# Patient Record
Sex: Female | Born: 1965 | Race: White | Hispanic: No | Smoking: Current every day smoker
Health system: Southern US, Community
[De-identification: ages and names within clinical notes are randomized; demographics above are authoritative.]

## PROBLEM LIST (undated history)

## (undated) DIAGNOSIS — R87629 Unspecified abnormal cytological findings in specimens from vagina: Secondary | ICD-10-CM

## (undated) DIAGNOSIS — R42 Dizziness and giddiness: Secondary | ICD-10-CM

## (undated) DIAGNOSIS — I10 Essential (primary) hypertension: Secondary | ICD-10-CM

## (undated) HISTORY — DX: Essential (primary) hypertension: I10

## (undated) HISTORY — PX: TUBAL LIGATION: SHX77

## (undated) HISTORY — PX: GANGLION CYST EXCISION: SHX1691

## (undated) HISTORY — PX: CARPAL TUNNEL RELEASE: SHX101

## (undated) HISTORY — DX: Unspecified abnormal cytological findings in specimens from vagina: R87.629

## (undated) HISTORY — DX: Dizziness and giddiness: R42

---

## 2013-08-16 ENCOUNTER — Emergency Department (HOSPITAL_COMMUNITY)
Admission: EM | Admit: 2013-08-16 | Discharge: 2013-08-16 | Disposition: A | Discharge status: Home / Self Care | Payer: Self-pay | Attending: Emergency Medicine | Admitting: Emergency Medicine

## 2013-08-16 ENCOUNTER — Encounter (HOSPITAL_COMMUNITY): Payer: Self-pay | Admitting: Emergency Medicine

## 2013-08-16 DIAGNOSIS — J020 Streptococcal pharyngitis: Secondary | ICD-10-CM | POA: Insufficient documentation

## 2013-08-16 DIAGNOSIS — F17200 Nicotine dependence, unspecified, uncomplicated: Secondary | ICD-10-CM | POA: Insufficient documentation

## 2013-08-16 LAB — RAPID STREP SCREEN (MED CTR MEBANE ONLY): Streptococcus, Group A Screen (Direct): POSITIVE — AB

## 2013-08-16 MED ORDER — HYDROCODONE-ACETAMINOPHEN 7.5-325 MG/15ML PO SOLN: 15.0000 mL | Freq: Four times a day (QID) | ORAL | Status: DC | PRN

## 2013-08-16 MED ORDER — PENICILLIN G BENZATHINE 1200000 UNIT/2ML IM SUSP
1.2000 10*6.[IU] | Freq: Once | INTRAMUSCULAR | Status: AC
  Administered 2013-08-16: 1.2 10*6.[IU] via INTRAMUSCULAR
  Filled 2013-08-16: qty 2

## 2013-08-16 NOTE — ED Notes (Signed)
Pt reporting sore throat began Thursday, also experiencing some pain in left ear.  Reports no relief from OTC allergy medications.

## 2013-08-16 NOTE — ED Provider Notes (Signed)
CSN: [id number]     Arrival date & time 08/16/13  1849 History   First MD Initiated Contact with Patient 08/16/13 1900     Chief Complaint  Patient presents with  . Sore Throat      HPI Pt was seen at 1910.  Per pt, c/o gradual onset and persistence of constant sore throat, runny/stuffy nose, sinus congestion, ears congestion, and cough for the past 5 days. Has been taking OTC Claritin without change in her symptoms. Denies fevers, no rash, no CP/SOB, no N/V/D, no abd pain.     History reviewed. No pertinent past medical history.  Past Surgical History  Procedure Laterality Date  . Tubal ligation      History  Substance Use Topics  . Smoking status: Current Every Day Smoker -- 0.50 packs/day  . Smokeless tobacco: Not on file  . Alcohol Use: No    Review of Systems ROS: Statement: All systems negative except as marked or noted in the HPI; Constitutional: Negative for fever and chills. ; ; Eyes: Negative for eye pain, redness and discharge. ; ; ENMT: Negative for ear pain, hoarseness, +rhinorrhea, ears congestion, nasal congestion, sinus pressure and sore throat. ; ; Cardiovascular: Negative for chest pain, palpitations, diaphoresis, dyspnea and peripheral edema. ; ; Respiratory: +cough. Negative for wheezing and stridor. ; ; Gastrointestinal: Negative for nausea, vomiting, diarrhea, abdominal pain, blood in stool, hematemesis, jaundice and rectal bleeding. . ; ; Genitourinary: Negative for dysuria, flank pain and hematuria. ; ; Musculoskeletal: Negative for back pain and neck pain. Negative for swelling and trauma.; ; Skin: Negative for pruritus, rash, abrasions, blisters, bruising and skin lesion.; ; Neuro: Negative for headache, lightheadedness and neck stiffness. Negative for weakness, altered level of consciousness , altered mental status, extremity weakness, paresthesias, involuntary movement, seizure and syncope.     Allergies  Review of patient's allergies indicates no known  allergies.  Home Medications   Prior to Admission medications   Not on File   BP 151/79  Pulse 77  Temp(Src) 98.2 F (36.8 C)  Resp 18  Ht 5\' 2"  (1.575 m)  Wt 190 lb (86.183 kg)  BMI 34.74 kg/m2  SpO2 95%  LMP 07/19/2013 Physical Exam 1915; Physical examination:  Nursing notes reviewed; Vital signs and O2 SAT reviewed;  Constitutional: Well developed, Well nourished, Well hydrated, In no acute distress; Head:  Normocephalic, atraumatic; Eyes: EOMI, PERRL, No scleral icterus; ENMT: TM's clear bilat. +edemetous nasal turbinates bilat with clear rhinorrhea. +post pharyngeal erythema. Mouth and pharynx without lesions. No tonsillar exudates. No intra-oral edema. No submandibular or sublingual edema. No hoarse voice, no drooling, no stridor. No pain with manipulation of larynx. No trismus. Mucous membranes moist; Neck: Supple, Full range of motion, No lymphadenopathy; Cardiovascular: Regular rate and rhythm, No murmur, rub, or gallop; Respiratory: Breath sounds clear & equal bilaterally, No rales, rhonchi, wheezes.  Speaking full sentences with ease, Normal respiratory effort/excursion; Chest: Nontender, Movement normal; Abdomen: Soft, Nontender, Nondistended, Normal bowel sounds; Genitourinary: No CVA tenderness; Extremities: Pulses normal, No tenderness, No edema, No calf edema or asymmetry.; Neuro: AA&Ox3, Major CN grossly intact.  Speech clear. No gross focal motor or sensory deficits in extremities. Climbs on and off stretcher easily by herself. Gait steady.; Skin: Color normal, Warm, Dry.   ED Course  Procedures     MDM  MDM Reviewed: previous chart, nursing note and vitals Interpretation: labs   Results for orders placed during the hospital encounter of 08/16/13  RAPID STREP SCREEN  Result Value Ref Range   Streptococcus, Group A Screen (Direct) POSITIVE (*) NEGATIVE    1940:  Will dose IM PCN while in the ED. Pt wants to go home now. Dx and testing d/w pt and family.   Questions answered.  Verb understanding, agreeable to d/c home with outpt f/u.     Laray AngerKathleen M McManus, DO 08/18/13 [id number]

## 2013-08-16 NOTE — Discharge Instructions (Signed)
°Emergency Department Resource Guide °1) Find a Doctor and Pay Out of Pocket °Although you won't have to find out who is covered by your insurance plan, it is a good idea to ask around and get recommendations. You will then need to call the office and see if the doctor you have chosen will accept you as a new patient and what types of options they offer for patients who are self-pay. Some doctors offer discounts or will set up payment plans for their patients who do not have insurance, but you will need to ask so you aren't surprised when you get to your appointment. ° °2) Contact Your Local Health Department °Not all health departments have doctors that can see patients for sick visits, but many do, so it is worth a call to see if yours does. If you don't know where your local health department is, you can check in your phone book. The CDC also has a tool to help you locate your state's health department, and many state websites also have listings of all of their local health departments. ° °3) Find a Walk-in Clinic °If your illness is not likely to be very severe or complicated, you may want to try a walk in clinic. These are popping up all over the country in pharmacies, drugstores, and shopping centers. They're usually staffed by nurse practitioners or physician assistants that have been trained to treat common illnesses and complaints. They're usually fairly quick and inexpensive. However, if you have serious medical issues or chronic medical problems, these are probably not your best option. ° °No Primary Care Doctor: °- Call [facility] at  832-8000 - they can help you locate a primary care doctor that  accepts your insurance, provides certain services, etc. °- Physician Referral Service- 1-800-533-3463 ° °Chronic Pain Problems: °Organization         Address  Phone   Notes  °[facility]  (336) [phone] Patients need to be referred by their primary care doctor.  ° °Medication  Assistance: °Organization         Address  Phone   Notes  °[place] [facility] [address] °[place], [place] [address] (336) 641-8030 --Must be a resident of [place] °-- Must have NO insurance coverage whatsoever (no Medicaid/ Medicare, etc.) °-- The pt. MUST have a primary care doctor that directs their care regularly and follows them in the community °  °[facility]  (866) 331-1348   °[employer]  (888) 892-1162   ° °Agencies that provide inexpensive medical care: °Organization         Address  Phone   Notes  °[facility]  (336) 832-8035   °[facility]    (336) 832-7272   °[facility] [address] °[place], [place] [address] (336) 832-4777   °[facility] [address], °[place] (336) 271-4999   °[facility]    (336) 373-0678   °[facility]    (336) 272-1050   °[facility] ° [address], [place] Phone:  (336) 832-4444, Fax:  (336) 832-4440 Hours of Operation:  9 am - 6 pm, M-F.  Also accepts Medicaid/Medicare and self-pay.  °[facility] ° [address], [place] Phone: (336) 832-3150, Fax: (336) [phone]. Hours of Operation:  8:30 am - 5:30 pm, M-F.  Also accepts Medicaid and self-pay.  °[facility] [place] [address], [place] Phone: (336) 878-6027   °[facility] [address], [place], [place] (336)723-1848, Ext. 123 Mondays & Thursdays: 7-9 AM.  First 15 patients are seen on a first come, first serve basis. °  ° °Medicaid-accepting [place] Providers: ° °Organization         Address  Phone   Notes  °[facility] [address], [place] (336) 641-2100 Also accepts self-pay patients.  °[facility] [address], [place] ° (336) 856-9996   °[facility] [address], [place]  (336) 288-8857   °[facility] [address] [place] [address], [place] (336) 299-7000   °Veita Bland [address], [place]  ° (336) 373-1557 Only accepts [place] Access Medicaid patients after they have their name applied to their card.  ° °Self-Pay (no insurance) in [place]: ° °Organization         Address  Phone   Notes  °Sickle Cell Patients, [facility] [address], [place] (336) 832-1970   °[facility] [address], [place] (336) 832-4400   °[facility] ° [address], [place] (336) 992-4800   °[facility]/Dr. Osei-Bonsu ° [address] [place] [address], [place] or [address], [place] (336) 841-8500 Phone number for both [place] and [place] locations is the same.  °[facility] [address], [place] (336) 299-0000   °[facility] [address] [place] [address], [place] or [address] (336) 852-7530 °(336) 878-2260   °[facility] [address], [place] (336) 350-1642, phone; (336) 294-5005, fax Sees patients 1st and 3rd Saturday of every month.  Must not qualify for public or private insurance (i.e. Medicaid, Medicare, [place] Health Choice, Veterans' Benefits) • Household income should be no more than 200% of the poverty level •The clinic cannot treat you if you are pregnant or think you are pregnant • Sexually transmitted diseases are not treated at the clinic.  ° ° °Dental Care: °Organization         Address  Phone  Notes  °[place] [facility] [address], [place] (336) 641-6152 Accepts children up to age 21 who are enrolled in Medicaid or [place] Health Choice; pregnant women with a Medicaid card; and children who have applied for Medicaid or [place] Health Choice, but were declined, whose parents can pay a reduced fee at time of service.  °[place]  [facility] [place]  [address], [place] (336) 641-7733 Accepts children up to age 21 who are enrolled in Medicaid or [place] Health Choice; pregnant women with a Medicaid card; and children who have applied for Medicaid or [place] Health Choice, but were declined, whose parents can pay a reduced fee at time of service.  °[facility] ° [address], [place] (336) 641-4533 Patients are seen by appointment only. Walk-ins are not accepted. [facility] will see patients 18 years of age and older. °Monday - Tuesday (8am-5pm) °Most Wednesdays (8:30-5pm) °$30 per visit, cash only  °[facility] ° [address], [place] (336) 641-4533 Patients are seen by appointment only. Walk-ins are not accepted. [facility] will see patients 18 years of age and older. °One   Wednesday Evening (Monthly: Volunteer Based).  $30 per visit, cash only  °[facility]  (919) 537-3737 for adults; Children under age 4, call Graduate Pediatric Dentistry at ([phone]) [phone]. Children aged 4-14, please call (919) [phone] to request a pediatric application. ° Dental services are provided in all areas of dental care including fillings, crowns and bridges, complete and partial dentures, implants, gum treatment, root canals, and extractions. Preventive care is also provided. Treatment is provided to both adults and children. °Patients are selected via a lottery and there is often a waiting list. °  °[facility] [address], °[place] ° (336) 763-8833 [email or web address] °  °[facility] [address], [place], [place] (336)723-1848, Ext. 123 Second and Fourth Thursday of each month, opens at 6:30 AM; Clinic ends at 9 AM.  Patients are seen on a first-come first-served basis, and a limited number are seen during each clinic.  ° °[facility] ° [address], [place], [place] (336) 723-7904    Eligibility Requirements °You must have lived in [place], [place], or [place] counties for at least the last three months. °  You cannot be eligible for state or federal sponsored healthcare insurance, including Veterans Administration, Medicaid, or Medicare. °  You generally cannot be eligible for healthcare insurance through your employer.  °  How to apply: °Eligibility screenings are held every Tuesday and Wednesday afternoon from 1:00 pm until 4:00 pm. You do not need an appointment for the interview!  °[facility] [address], [place], [place] 336-631-2330   °[facility]  336-342-8273   °[facility]  336-703-3100   °[facility]  336-570-6415   ° °Behavioral Health Resources in the Community: °Intensive Outpatient Programs °Organization         Address  Phone  Notes  °[facility] [address], [place], [place] 336-878-6098   °[facility] [address], [place], [place] 336-832-9800   °[facility] [address], [place], [place] ° 336-882-2125   °[facility] [address],  °[place], [place] 1-800-853-5163 or 336-641-4981   °Substance Abuse Resources °Organization         Address  Phone  Notes  °[facility]  336-882-2125   °[facility]  336-784-9470   °[facility]  336-285-9073   °[facility]  336-845-3988   °Residential & Outpatient Substance Abuse Program  1-800-659-3381   °Psychological Services °Organization         Address  Phone  Notes  °[facility]  336- 832-9600   °[facility]  336- 378-7881   °[facility] [address], [place] 1-800-853-5163 or 336-641-4981   ° °Mobile Crisis Teams °Organization         Address  Phone  Notes  °[facility], Mobile Crisis Care Unit  1-877-626-1772   °Assertive °[facility] ° [address]  [place], [place] 336-834-9664   °Sharon DeEsch [address] °[place] 336-554-5454   ° °Self-Help/Support Groups °Organization         Address  Phone             Notes  °[facility] of  - variety of support groups  336- 373-1402 Call for more information  °Narcotics Anonymous (NA), [facility] [address], °[place]  2 meetings at this location  ° °  Residential Treatment Programs Organization         Address  Phone  Notes  [facility] [address],    [place]  [phone]   [facility]  [address], [place] [phone], [place], [place] [phone]   [facility] [address], [place] [phone] Admissions: 8am-3pm M-F  [facility] [address]    [place], [place] [phone]   [facility] [address], [place], [place] [phone]   [facility] [address]  [place], [place] [phone]   [facility] Intensive Outpatient [address], [place], [place] [phone]   [facility] ([facility] [address]  [place], [place] [phone] or ([phone]   [facility] [address] [place], [place] [phone] Accepts Medicaid  [facility] [address]  [place] [phone] Substance Abuse/Addiction Treatment   [facility] Resources Organization         Address  Phone  Notes  [facility]  930-231-7115(888) 712-119-7891   Angie FavaJulie Brannon, PhD [address], [address] [place], [place]   [phone](336) [phone] or (503)[phone](336) [phone]   [facility]   [address] [place], [place] [phone](336) [phone]   [facility] [address], [place], [place] [phone](336) [phone] Insurance/Medicaid/sponsorship through [facility] [address]                                    [place], [place] [phone](336) [phone] Therapy/tele-psych/case    [facility] [address]   [place], [place] [phone](336) [phone]    Dr. Lolly MustacheArfeen  [phone](336) [phone]   [facility] [place] [place]  [facility] 1) [address], [place] 2) [address], [place] 3)  [address], [place] [phone](336) [phone](336) [phone]  [phone](336) [phone]   [facility] Child Abuse Hotline 725-069-9500(336) 930-837-8726 or 716-614-0325(336) (601) [phone] (After Hours)       Take over the counter tylenol and ibuprofen, as directed on packaging, as needed for discomfort. Do not take extra tylenol if you are taking the prescription pain medication because it already has tylenol in it. You received an injection of penicillin while you were in the Emergency Department today; this will fully treat your "strep throat."  Gargle with warm water several times per day to help with discomfort.  May also use over the counter sore throat pain medicines such as chloraseptic or sucrets, as directed on packaging, as needed for discomfort.  Call your regular medical doctor tomorrow to schedule a follow up appointment this week.  Return to the Emergency Department immediately if worsening.

## 2015-11-24 ENCOUNTER — Encounter (HOSPITAL_COMMUNITY): Payer: Self-pay

## 2015-11-24 ENCOUNTER — Emergency Department (HOSPITAL_COMMUNITY)
Admission: EM | Admit: 2015-11-24 | Discharge: 2015-11-24 | Disposition: A | Discharge status: Home / Self Care | Payer: 59 | Attending: Emergency Medicine | Admitting: Emergency Medicine

## 2015-11-24 ENCOUNTER — Emergency Department (HOSPITAL_COMMUNITY): Payer: 59

## 2015-11-24 DIAGNOSIS — F17200 Nicotine dependence, unspecified, uncomplicated: Secondary | ICD-10-CM | POA: Insufficient documentation

## 2015-11-24 DIAGNOSIS — R42 Dizziness and giddiness: Secondary | ICD-10-CM | POA: Insufficient documentation

## 2015-11-24 DIAGNOSIS — Z79899 Other long term (current) drug therapy: Secondary | ICD-10-CM | POA: Insufficient documentation

## 2015-11-24 LAB — URINE MICROSCOPIC-ADD ON

## 2015-11-24 LAB — PREGNANCY, URINE: PREG TEST UR: NEGATIVE

## 2015-11-24 LAB — CBC WITH DIFFERENTIAL/PLATELET
BASOS PCT: 1 %
Basophils Absolute: 0 10*3/uL (ref 0.0–0.1)
Eosinophils Absolute: 0.2 10*3/uL (ref 0.0–0.7)
Eosinophils Relative: 3 %
HEMATOCRIT: 42.4 % (ref 36.0–46.0)
HEMOGLOBIN: 14.9 g/dL (ref 12.0–15.0)
Lymphocytes Relative: 30 %
Lymphs Abs: 2.4 10*3/uL (ref 0.7–4.0)
MCH: 32.7 pg (ref 26.0–34.0)
MCHC: 35.1 g/dL (ref 30.0–36.0)
MCV: 93 fL (ref 78.0–100.0)
Monocytes Absolute: 0.7 10*3/uL (ref 0.1–1.0)
Monocytes Relative: 9 %
NEUTROS ABS: 4.5 10*3/uL (ref 1.7–7.7)
Neutrophils Relative %: 57 %
Platelets: 221 10*3/uL (ref 150–400)
RBC: 4.56 MIL/uL (ref 3.87–5.11)
RDW: 13.5 % (ref 11.5–15.5)
WBC: 7.8 10*3/uL (ref 4.0–10.5)

## 2015-11-24 LAB — URINALYSIS, ROUTINE W REFLEX MICROSCOPIC
Bilirubin Urine: NEGATIVE
Glucose, UA: NEGATIVE mg/dL
KETONES UR: NEGATIVE mg/dL
Nitrite: NEGATIVE
Protein, ur: NEGATIVE mg/dL
Specific Gravity, Urine: 1.02 (ref 1.005–1.030)
pH: 5.5 (ref 5.0–8.0)

## 2015-11-24 LAB — COMPREHENSIVE METABOLIC PANEL
ALT: 17 U/L (ref 14–54)
ANION GAP: 7 (ref 5–15)
AST: 18 U/L (ref 15–41)
Albumin: 3.9 g/dL (ref 3.5–5.0)
Alkaline Phosphatase: 76 U/L (ref 38–126)
BUN: 15 mg/dL (ref 6–20)
CO2: 26 mmol/L (ref 22–32)
Calcium: 9 mg/dL (ref 8.9–10.3)
Chloride: 102 mmol/L (ref 101–111)
Creatinine, Ser: 0.72 mg/dL (ref 0.44–1.00)
GFR calc non Af Amer: >60 mL/min (ref >60)
Glucose, Bld: 106 mg/dL — ABNORMAL HIGH (ref 65–99)
Potassium: 4 mmol/L (ref 3.5–5.1)
SODIUM: 135 mmol/L (ref 135–145)
Total Bilirubin: 0.6 mg/dL (ref 0.3–1.2)
Total Protein: 7.9 g/dL (ref 6.5–8.1)

## 2015-11-24 LAB — POC URINE PREG, ED: Preg Test, Ur: NEGATIVE

## 2015-11-24 MED ORDER — MECLIZINE HCL 12.5 MG PO TABS
25.0000 mg | ORAL_TABLET | Freq: Once | ORAL | Status: AC
  Administered 2015-11-24: 25 mg via ORAL
  Filled 2015-11-24: qty 2

## 2015-11-24 MED ORDER — MECLIZINE HCL 25 MG PO TABS: 25.0000 mg | ORAL_TABLET | Freq: Three times a day (TID) | ORAL | 0 refills | Status: AC | PRN

## 2015-11-24 MED ORDER — SODIUM CHLORIDE 0.9 % IV BOLUS (SEPSIS)
1000.0000 mL | Freq: Once | INTRAVENOUS | Status: AC
  Administered 2015-11-24: 1000 mL via INTRAVENOUS

## 2015-11-24 NOTE — ED Triage Notes (Signed)
Pt reports dizziness and feeling off balance for 6 days. Denies CP or SOB. Reports ears being stopped up and nasal congestion in which she is taking sudafed . No nausea

## 2015-11-24 NOTE — ED Provider Notes (Signed)
[facility] DEPT Provider Note   CSN: [id number] Arrival date & time: 11/24/15  0716     History   Chief Complaint Chief Complaint  Patient presents with  . Dizziness    HPI Robin Frost is a 50 y.o. female.  Patient complains of dizziness. Patient states room spinning when she gets up and walks around   The history is provided by the patient.  Dizziness  Quality:  Head spinning Severity:  Mild Onset quality:  Sudden Timing:  Constant Progression:  Unchanged Chronicity:  New Context: bending over   Relieved by:  Nothing Worsened by:  Turning head and standing up Associated symptoms: no blood in stool, no chest pain, no diarrhea and no headaches     History reviewed. No pertinent past medical history.  There are no active problems to display for this patient.   Past Surgical History:  Procedure Laterality Date  . TUBAL LIGATION      OB History    No data available       Home Medications    Prior to Admission medications   Medication Sig Start Date End Date Taking? Authorizing Provider  pseudoephedrine (SUDAFED) 60 MG tablet Take 60 mg by mouth every 4 (four) hours as needed for congestion.   Yes Historical Provider, MD  meclizine (ANTIVERT) 25 MG tablet Take 1 tablet (25 mg total) by mouth 3 (three) times daily as needed for dizziness. 11/24/15   Bethann Berkshire, MD    Family History No family history on file.  Social History Social History  Substance Use Topics  . Smoking status: Current Every Day Smoker    Packs/day: 0.50    Years: 5.00  . Smokeless tobacco: Never Used  . Alcohol use No     Allergies   Review of patient's allergies indicates no known allergies.   Review of Systems Review of Systems  Constitutional: Negative for appetite change and fatigue.  HENT: Negative for congestion, ear discharge and sinus pressure.   Eyes: Negative for discharge.  Respiratory: Negative for cough.   Cardiovascular: Negative for chest pain.    Gastrointestinal: Negative for abdominal pain, blood in stool and diarrhea.  Genitourinary: Negative for frequency and hematuria.  Musculoskeletal: Negative for back pain.  Skin: Negative for rash.  Neurological: Positive for dizziness. Negative for seizures and headaches.  Psychiatric/Behavioral: Negative for hallucinations.     Physical Exam Updated Vital Signs BP 141/82 (BP Location: Left Arm)   Pulse 70   Temp 97.5 F (36.4 C) (Oral)   Resp 18   Ht 5\' 2"  (1.575 m)   Wt 195 lb (88.5 kg)   LMP 10/23/2015   SpO2 100%   BMI 35.67 kg/m   Physical Exam  Constitutional: She is oriented to person, place, and time. She appears well-developed.  HENT:  Head: Normocephalic.  Eyes: Conjunctivae and EOM are normal. No scleral icterus.  Neck: Neck supple. No thyromegaly present.  Cardiovascular: Normal rate and regular rhythm.  Exam reveals no gallop and no friction rub.   No murmur heard. Pulmonary/Chest: No stridor. She has no wheezes. She has no rales. She exhibits no tenderness.  Abdominal: She exhibits no distension. There is no tenderness. There is no rebound.  Musculoskeletal: Normal range of motion. She exhibits no edema.  Lymphadenopathy:    She has no cervical adenopathy.  Neurological: She is oriented to person, place, and time. She exhibits normal muscle tone. Coordination normal.  Skin: No rash noted. No erythema.  Psychiatric: She has a normal  mood and affect. Her behavior is normal.     ED Treatments / Results  Labs (all labs ordered are listed, but only abnormal results are displayed) Labs Reviewed  COMPREHENSIVE METABOLIC PANEL - Abnormal; Notable for the following:       Result Value   Glucose, Bld 106 (*)    All other components within normal limits  URINALYSIS, ROUTINE W REFLEX MICROSCOPIC (NOT AT [facility]) - Abnormal; Notable for the following:    APPearance HAZY (*)    Hgb urine dipstick LARGE (*)    Leukocytes, UA TRACE (*)    All other components  within normal limits  URINE MICROSCOPIC-ADD ON - Abnormal; Notable for the following:    Squamous Epithelial / LPF 0-5 (*)    Bacteria, UA FEW (*)    All other components within normal limits  CBC WITH DIFFERENTIAL/PLATELET  PREGNANCY, URINE  POC URINE PREG, ED    EKG  EKG Interpretation None       Radiology Ct Head Wo Contrast  Result Date: 11/24/2015 CLINICAL DATA:  Six day history of dizziness EXAM: CT HEAD WITHOUT CONTRAST TECHNIQUE: Contiguous axial images were obtained from the base of the skull through the vertex without intravenous contrast. COMPARISON:  None. FINDINGS: Brain: The ventricles are normal in size and configuration. There is mild frontal lobe atrophy for age bilaterally. There is no intracranial mass, hemorrhage, extra-axial fluid collection, or midline shift. Gray-white compartments are normal. No acute infarct evident. Vascular: No hyperdense vessel. No evident arterial vascular calcification. Skull: Bony calvarium appears intact. Sinuses/Orbits: There is mild mucosal thickening in several ethmoid air cells bilaterally. Visualized paranasal sinuses elsewhere clear. There is leftward deviation of the nasal septum. No intraorbital lesions are evident on either side. Other: Mastoid air cells are hypoplastic on the right. Mastoid air cells which are aerated are clear. IMPRESSION: Mild frontal lobe atrophy. Ventricles normal in size and configuration. No intracranial mass, hemorrhage, or extra-axial fluid. Gray-white compartments appear normal. Mild ethmoid sinus disease bilaterally. Electronically Signed   By: Bretta BangWilliam  Woodruff III M.D.   On: 11/24/2015 08:32    Procedures Procedures (including critical care time)  Medications Ordered in ED Medications  meclizine (ANTIVERT) tablet 25 mg (not administered)  sodium chloride 0.9 % bolus 1,000 mL (0 mLs Intravenous Stopped 11/24/15 0945)     Initial Impression / Assessment and Plan / ED Course  I have reviewed the  triage vital signs and the nursing notes.  Pertinent labs & imaging results that were available during my care of the patient were reviewed by me and considered in my medical decision making (see chart for details).  Clinical Course    Labs and CT scan unremarkable. Suspect vertigo. Patient will be treated with Antivert will follow-up with PCP  Final Clinical Impressions(s) / ED Diagnoses   Final diagnoses:  Dizziness  Vertigo    New Prescriptions New Prescriptions   MECLIZINE (ANTIVERT) 25 MG TABLET    Take 1 tablet (25 mg total) by mouth 3 (three) times daily as needed for dizziness.     Bethann BerkshireJoseph Jearldine Cassady, MD 11/24/15 1053

## 2015-11-24 NOTE — Discharge Instructions (Signed)
Follow-up this week with your doctor for recheck

## 2015-11-27 ENCOUNTER — Other Ambulatory Visit (HOSPITAL_COMMUNITY): Payer: Self-pay | Admitting: Internal Medicine

## 2015-11-27 DIAGNOSIS — Z1231 Encounter for screening mammogram for malignant neoplasm of breast: Secondary | ICD-10-CM

## 2015-11-27 DIAGNOSIS — N390 Urinary tract infection, site not specified: Secondary | ICD-10-CM | POA: Diagnosis not present

## 2015-11-27 DIAGNOSIS — R42 Dizziness and giddiness: Secondary | ICD-10-CM | POA: Diagnosis not present

## 2015-11-27 DIAGNOSIS — Z0000 Encounter for general adult medical examination without abnormal findings: Secondary | ICD-10-CM | POA: Diagnosis not present

## 2015-12-05 ENCOUNTER — Ambulatory Visit (INDEPENDENT_AMBULATORY_CARE_PROVIDER_SITE_OTHER): Payer: 59 | Admitting: Obstetrics and Gynecology

## 2015-12-05 ENCOUNTER — Encounter: Payer: Self-pay | Admitting: Obstetrics and Gynecology

## 2015-12-05 ENCOUNTER — Other Ambulatory Visit (HOSPITAL_COMMUNITY)
Admission: RE | Admit: 2015-12-05 | Discharge: 2015-12-05 | Disposition: A | Discharge status: Home / Self Care | Payer: 59 | Source: Ambulatory Visit | Attending: Obstetrics and Gynecology | Admitting: Obstetrics and Gynecology

## 2015-12-05 VITALS — BP 160/82 | HR 66 | Ht 62.0 in | Wt 215.2 lb

## 2015-12-05 DIAGNOSIS — Z01411 Encounter for gynecological examination (general) (routine) with abnormal findings: Secondary | ICD-10-CM | POA: Diagnosis not present

## 2015-12-05 DIAGNOSIS — Z3202 Encounter for pregnancy test, result negative: Secondary | ICD-10-CM

## 2015-12-05 DIAGNOSIS — Z1151 Encounter for screening for human papillomavirus (HPV): Secondary | ICD-10-CM | POA: Insufficient documentation

## 2015-12-05 DIAGNOSIS — Z01419 Encounter for gynecological examination (general) (routine) without abnormal findings: Secondary | ICD-10-CM

## 2015-12-05 DIAGNOSIS — N912 Amenorrhea, unspecified: Secondary | ICD-10-CM

## 2015-12-05 DIAGNOSIS — N870 Mild cervical dysplasia: Secondary | ICD-10-CM | POA: Diagnosis not present

## 2015-12-05 LAB — POCT URINE PREGNANCY: Preg Test, Ur: NEGATIVE

## 2015-12-05 NOTE — Progress Notes (Signed)
Patient ID: Robin Frost, female   DOB: 01-14-1966, 50 y.o.   MRN: [id number]  Assessment:  1. Annual Gyn Exam 2. Lengthy discussion of healthy eating habits, lifestyle changes recommended.   3. Encouraged adequate water intake throughout the day to prevent overeating.    Plan:  1. pap smear done, next pap due in 3 years  2. return annually or prn 3    Annual mammogram and regular self exams advised  Subjective:   Chief Complaint  Patient presents with  . Gynecologic Exam     Robin Frost is a 50 y.o. female No obstetric history on file. who presents for annual exam. Patient's last menstrual period was 10/23/2015. The patient has no complaints today. She notes her period is late this month. Urine preg negative today.  She denies any abdominal pain or discomfort. She reports her periods typically have 3 days of heavy bleeding followed by a few days of light bleeding. She does note some recent weight gain, but attributes this to her diet.   PCP- Dr. Selena Batten   The following portions of the patient's history were reviewed and updated as appropriate: allergies, current medications, past family history, past medical history, past social history, past surgical history and problem list. History reviewed. No pertinent past medical history.  Past Surgical History:  Procedure Laterality Date  . CARPAL TUNNEL RELEASE     left wrist  . GANGLION CYST EXCISION     right foot  . TUBAL LIGATION       Current Outpatient Prescriptions:  .  meclizine (ANTIVERT) 25 MG tablet, Take 1 tablet (25 mg total) by mouth 3 (three) times daily as needed for dizziness. (Patient not taking: Reported on 12/05/2015), Disp: 30 tablet, Rfl: 0 .  pseudoephedrine (SUDAFED) 60 MG tablet, Take 60 mg by mouth every 4 (four) hours as needed for congestion., Disp: , Rfl:   Review of Systems Constitutional: negative Gastrointestinal: negative Genitourinary: negative   Objective:  BP (!) 160/82   Pulse 66   Ht 5'  2" (1.575 m)   Wt 215 lb 3.2 oz (97.6 kg)   LMP 10/23/2015   BMI 39.36 kg/m    BMI: Body mass index is 39.36 kg/m.  General Appearance: Alert, appropriate appearance for age. No acute distress HEENT: Grossly normal Neck / Thyroid:  Cardiovascular: RRR; normal S1, S2, no murmur Lungs: CTA bilaterally Back: No CVAT Breast Exam: No masses or nodes.No dimpling, nipple retraction or discharge. Gastrointestinal: Soft, non-tender, no masses or organomegaly Pelvic Exam:  External genitalia: normal general appearance Vaginal: normal mucosa without prolapse or lesions Cervix: normal appearance Adnexa: normal bimanual exam Uterus: normal single, nontender, 8w size, anterior  Rectovaginal: normal rectal, no masses Lymphatic Exam: Non-palpable nodes in neck, clavicular, axillary, or inguinal regions  Skin: no rash or abnormalities Neurologic: Normal gait and speech, no tremor  Psychiatric: Alert and oriented, appropriate affect.  Urinalysis:Not done  Guaiac negative   Christin Bach. MD Pgr [phone] 8:53 AM  The provider spent over 25 minutes with the visit, including pre visit review, documentation, with >than 50% spent in counseling and coordination of care.   By signing my name below, I, Doreatha Martin, attest that this documentation has been prepared under the direction and in the presence of Tilda Burrow, MD. Electronically Signed: Doreatha Martin, ED Scribe. 12/05/15. 8:53 AM.  I personally performed the services described in this documentation, which was SCRIBED in my presence. The recorded information has been reviewed and considered accurate. It has  been edited as necessary during review. Tilda BurrowFERGUSON,Vi Biddinger V, MD

## 2015-12-07 LAB — CYTOLOGY - PAP: HPV: DETECTED — AB

## 2015-12-10 ENCOUNTER — Telehealth: Payer: Self-pay | Admitting: *Deleted

## 2015-12-10 ENCOUNTER — Ambulatory Visit (HOSPITAL_COMMUNITY)
Admission: RE | Admit: 2015-12-10 | Discharge: 2015-12-10 | Disposition: A | Discharge status: Home / Self Care | Payer: 59 | Source: Ambulatory Visit | Attending: Internal Medicine | Admitting: Internal Medicine

## 2015-12-10 ENCOUNTER — Encounter (HOSPITAL_COMMUNITY): Payer: Self-pay | Admitting: Radiology

## 2015-12-10 DIAGNOSIS — Z1231 Encounter for screening mammogram for malignant neoplasm of breast: Secondary | ICD-10-CM

## 2015-12-12 ENCOUNTER — Other Ambulatory Visit: Payer: Self-pay | Admitting: Internal Medicine

## 2015-12-12 DIAGNOSIS — R928 Other abnormal and inconclusive findings on diagnostic imaging of breast: Secondary | ICD-10-CM

## 2015-12-19 ENCOUNTER — Encounter: Payer: Self-pay | Admitting: Neurology

## 2015-12-19 ENCOUNTER — Ambulatory Visit (INDEPENDENT_AMBULATORY_CARE_PROVIDER_SITE_OTHER): Payer: 59 | Admitting: Neurology

## 2015-12-19 DIAGNOSIS — H8110 Benign paroxysmal vertigo, unspecified ear: Secondary | ICD-10-CM

## 2015-12-19 NOTE — Progress Notes (Signed)
PATIENT: Robin Frost DOB: Sep 18, 1965  Chief Complaint  Patient presents with  . Dizziness    States she had a one week period of dizziness that worsened with quick turns of her head to the left.  She went to the ED and was given meclizine. States these symptoms have now resolved and she has not had any further problems.  She occasionally wakes up with mild neck pain but feels it is related to her sleeping positions.  Marland Kitchen. PCP    Pearson GrippeJames Kim, MD     HISTORICAL  Robin Frost is a 50 years old right-handed female, seen in refer by her primary care doctor Pearson GrippeJames Kim, for evaluation of vertigo, initial evaluation was on December 19 2015.  She was previously healthy, works as a Arboriculturistcustodian for [employer], on November 18 2015, she woke up in the morning noticed sudden onset vertigo after she set up from the bed, room was spinning counterclockwise, mild unsteady gait, nauseous, worsening with sudden body movement. Her symptoms was intermittent throughout the week, by November 24 2015, she had severe vertigo, could not go to her work, went to the emergency room,  I personally reviewed CT head without contrast there was no significant abnormality. I reviewed her laboratory evaluation October 2017, normal CMP, CBC with hemoglobin of 14 point 9,   REVIEW OF SYSTEMS: Full 14 system review of systems performed and notable only for cough,  ALLERGIES: No Known Allergies  HOME MEDICATIONS: Current Outpatient Prescriptions  Medication Sig Dispense Refill  . meclizine (ANTIVERT) 25 MG tablet Take 1 tablet (25 mg total) by mouth 3 (three) times daily as needed for dizziness. 30 tablet 0  . pseudoephedrine (SUDAFED) 60 MG tablet Take 60 mg by mouth every 4 (four) hours as needed for congestion.     No current facility-administered medications for this visit.     PAST MEDICAL HISTORY: Past Medical History:  Diagnosis Date  . Vertigo     PAST SURGICAL HISTORY: Past Surgical History:    Procedure Laterality Date  . CARPAL TUNNEL RELEASE     left wrist  . GANGLION CYST EXCISION     right foot  . TUBAL LIGATION      FAMILY HISTORY: Family History  Problem Relation Age of Onset  . Diabetes Mother   . Hypertension Mother   . Diabetes Father   . Hypertension Father   . Diabetes Sister   . Hypertension Sister   . Thyroid disease Brother     SOCIAL HISTORY:  Social History   Social History  . Marital status: Widowed    Spouse name: N/A  . Number of children: 2  . Years of education: HS   Occupational History  . Housekeeping    Social History Main Topics  . Smoking status: Current Every Day Smoker    Packs/day: 0.50    Years: 5.00  . Smokeless tobacco: Never Used  . Alcohol use No  . Drug use: No  . Sexual activity: Yes    Partners: Male    Birth control/ protection: Surgical   Other Topics Concern  . Not on file   Social History Narrative   Lives at home with sister, bother and their families.   Right-handed.   1-2 cups caffeine daily.     PHYSICAL EXAM   Vitals:   12/19/15 1015  BP: 126/78  Pulse: 66  Weight: 213 lb 8 oz (96.8 kg)  Height: 5\' 2"  (1.575 m)    Not  recorded      Body mass index is 39.05 kg/m.  PHYSICAL EXAMNIATION:  Gen: NAD, conversant, well nourised, obese, well groomed                     Cardiovascular: Regular rate rhythm, no peripheral edema, warm, nontender. Eyes: Conjunctivae clear without exudates or hemorrhage Neck: Supple, no carotid bruits. Pulmonary: Clear to auscultation bilaterally   NEUROLOGICAL EXAM:  MENTAL STATUS: Speech:    Speech is normal; fluent and spontaneous with normal comprehension.  Cognition:     Orientation to time, place and person     Normal recent and remote memory     Normal Attention span and concentration     Normal Language, naming, repeating,spontaneous speech     Fund of knowledge   CRANIAL NERVES: CN II: Visual fields are full to confrontation. Fundoscopic  exam is normal with sharp discs and no vascular changes. Pupils are round equal and briskly reactive to light. CN III, IV, VI: extraocular movement are normal. No ptosis. CN V: Facial sensation is intact to pinprick in all 3 divisions bilaterally. Corneal responses are intact.  CN VII: Face is symmetric with normal eye closure and smile. CN VIII: Hearing is normal to rubbing fingers CN IX, X: Palate elevates symmetrically. Phonation is normal. CN XI: Head turning and shoulder shrug are intact CN XII: Tongue is midline with normal movements and no atrophy.  MOTOR: There is no pronator drift of out-stretched arms. Muscle bulk and tone are normal. Muscle strength is normal.  REFLEXES: Reflexes are 2+ and symmetric at the biceps, triceps, knees, and ankles. Plantar responses are flexor.  SENSORY: Intact to light touch, pinprick, positional sensation and vibratory sensation are intact in fingers and toes.  COORDINATION: Rapid alternating movements and fine finger movements are intact. There is no dysmetria on finger-to-nose and heel-knee-shin.    GAIT/STANCE: Posture is normal. Gait is steady with normal steps, base, arm swing, and turning. Heel and toe walking are normal. Tandem gait is normal.  Romberg is absent. Apley's maneuver: Right ear dependent positioning induced no dizziness, no vertigo   DIAGNOSTIC DATA (LABS, IMAGING, TESTING) - I reviewed patient records, labs, notes, testing and imaging myself where available.   ASSESSMENT AND PLAN  Robin Frost is a 50 y.o. female   Benign positional vertigo Much improved,   keep well hydration,   I have printed out educational information about vertigo, also taught her repositioning maneuver in case it recurrent again  Levert FeinsteinYijun Aveya Beal, M.D. Ph.D.  [facility] [address], [address] [place], [place] [address] Ph: [phone] Fax: [phone]  ZH:YQMVHCC:James Selena BattenKim, MD

## 2015-12-19 NOTE — Patient Instructions (Signed)
Benign Positional Vertigo Vertigo is the feeling that you or your surroundings are moving when they are not. Benign positional vertigo is the most common form of vertigo. The cause of this condition is not serious (is benign). This condition is triggered by certain movements and positions (is positional). This condition can be dangerous if it occurs while you are doing something that could endanger you or others, such as driving.  CAUSES In many cases, the cause of this condition is not known. It may be caused by a disturbance in an area of the inner ear that helps your brain to sense movement and balance. This disturbance can be caused by a viral infection (labyrinthitis), head injury, or repetitive motion. RISK FACTORS This condition is more likely to develop in:  Women.  People who are 50 years of age or older. SYMPTOMS Symptoms of this condition usually happen when you move your head or your eyes in different directions. Symptoms may start suddenly, and they usually last for less than a minute. Symptoms may include:  Loss of balance and falling.  Feeling like you are spinning or moving.  Feeling like your surroundings are spinning or moving.  Nausea and vomiting.  Blurred vision.  Dizziness.  Involuntary eye movement (nystagmus). Symptoms can be mild and cause only slight annoyance, or they can be severe and interfere with daily life. Episodes of benign positional vertigo may return (recur) over time, and they may be triggered by certain movements. Symptoms may improve over time. DIAGNOSIS This condition is usually diagnosed by medical history and a physical exam of the head, neck, and ears. You may be referred to a health care provider who specializes in ear, nose, and throat (ENT) problems (otolaryngologist) or a provider who specializes in disorders of the nervous system (neurologist). You may have additional testing, including:  MRI.  A CT scan.  Eye movement tests. Your  health care provider may ask you to change positions quickly while he or she watches you for symptoms of benign positional vertigo, such as nystagmus. Eye movement may be tested with an electronystagmogram (ENG), caloric stimulation, the Dix-Hallpike test, or the roll test.  An electroencephalogram (EEG). This records electrical activity in your brain.  Hearing tests. TREATMENT Usually, your health care provider will treat this by moving your head in specific positions to adjust your inner ear back to normal. Surgery may be needed in severe cases, but this is rare. In some cases, benign positional vertigo may resolve on its own in 2-4 weeks. HOME CARE INSTRUCTIONS Safety  Move slowly.Avoid sudden body or head movements.  Avoid driving.  Avoid operating heavy machinery.  Avoid doing any tasks that would be dangerous to you or others if a vertigo episode would occur.  If you have trouble walking or keeping your balance, try using a cane for stability. If you feel dizzy or unstable, sit down right away.  Return to your normal activities as told by your health care provider. Ask your health care provider what activities are safe for you. General Instructions  Take over-the-counter and prescription medicines only as told by your health care provider.  Avoid certain positions or movements as told by your health care provider.  Drink enough fluid to keep your urine clear or pale yellow.  Keep all follow-up visits as told by your health care provider. This is important. SEEK MEDICAL CARE IF:  You have a fever.  Your condition gets worse or you develop new symptoms.  Your family or friends   notice any behavioral changes.  Your nausea or vomiting gets worse.  You have numbness or a "pins and needles" sensation. SEEK IMMEDIATE MEDICAL CARE IF:  You have difficulty speaking or moving.  You are always dizzy.  You faint.  You develop severe headaches.  You have weakness in your  legs or arms.  You have changes in your hearing or vision.  You develop a stiff neck.  You develop sensitivity to light.   This information is not intended to replace advice given to you by your health care provider. Make sure you discuss any questions you have with your health care provider.   Document Released: 11/11/2005 Document Revised: 10/25/2014 Document Reviewed: 05/29/2014 Elsevier Interactive Patient Education 2016 [employer]  

## 2015-12-21 DIAGNOSIS — Z720 Tobacco use: Secondary | ICD-10-CM | POA: Diagnosis not present

## 2015-12-21 DIAGNOSIS — R05 Cough: Secondary | ICD-10-CM | POA: Diagnosis not present

## 2015-12-21 DIAGNOSIS — R42 Dizziness and giddiness: Secondary | ICD-10-CM | POA: Diagnosis not present

## 2015-12-25 ENCOUNTER — Ambulatory Visit
Admission: RE | Admit: 2015-12-25 | Discharge: 2015-12-25 | Disposition: A | Discharge status: Home / Self Care | Payer: 59 | Source: Ambulatory Visit | Attending: Internal Medicine | Admitting: Internal Medicine

## 2015-12-25 DIAGNOSIS — N6323 Unspecified lump in the left breast, lower outer quadrant: Secondary | ICD-10-CM | POA: Diagnosis not present

## 2015-12-25 DIAGNOSIS — R928 Other abnormal and inconclusive findings on diagnostic imaging of breast: Secondary | ICD-10-CM

## 2015-12-31 ENCOUNTER — Encounter (HOSPITAL_COMMUNITY): Payer: Self-pay | Admitting: Emergency Medicine

## 2015-12-31 ENCOUNTER — Emergency Department (HOSPITAL_COMMUNITY)
Admission: EM | Admit: 2015-12-31 | Discharge: 2015-12-31 | Disposition: A | Discharge status: Home / Self Care | Payer: 59 | Attending: Emergency Medicine | Admitting: Emergency Medicine

## 2015-12-31 DIAGNOSIS — F17200 Nicotine dependence, unspecified, uncomplicated: Secondary | ICD-10-CM | POA: Insufficient documentation

## 2015-12-31 DIAGNOSIS — Y999 Unspecified external cause status: Secondary | ICD-10-CM | POA: Diagnosis not present

## 2015-12-31 DIAGNOSIS — S39012A Strain of muscle, fascia and tendon of lower back, initial encounter: Secondary | ICD-10-CM | POA: Diagnosis not present

## 2015-12-31 DIAGNOSIS — Y929 Unspecified place or not applicable: Secondary | ICD-10-CM | POA: Insufficient documentation

## 2015-12-31 DIAGNOSIS — J209 Acute bronchitis, unspecified: Secondary | ICD-10-CM | POA: Diagnosis not present

## 2015-12-31 DIAGNOSIS — Y939 Activity, unspecified: Secondary | ICD-10-CM | POA: Diagnosis not present

## 2015-12-31 DIAGNOSIS — S3992XA Unspecified injury of lower back, initial encounter: Secondary | ICD-10-CM | POA: Diagnosis present

## 2015-12-31 DIAGNOSIS — X58XXXA Exposure to other specified factors, initial encounter: Secondary | ICD-10-CM | POA: Insufficient documentation

## 2015-12-31 DIAGNOSIS — T148XXA Other injury of unspecified body region, initial encounter: Secondary | ICD-10-CM

## 2015-12-31 MED ORDER — NAPROXEN 500 MG PO TABS: 500.0000 mg | ORAL_TABLET | Freq: Two times a day (BID) | ORAL | 0 refills | Status: DC

## 2015-12-31 MED ORDER — BENZONATATE 100 MG PO CAPS
200.0000 mg | ORAL_CAPSULE | Freq: Once | ORAL | Status: AC
  Administered 2015-12-31: 200 mg via ORAL
  Filled 2015-12-31: qty 2

## 2015-12-31 MED ORDER — BENZONATATE 100 MG PO CAPS: 200.0000 mg | ORAL_CAPSULE | Freq: Three times a day (TID) | ORAL | 0 refills | Status: DC | PRN

## 2015-12-31 MED ORDER — ALBUTEROL SULFATE HFA 108 (90 BASE) MCG/ACT IN AERS
2.0000 | INHALATION_SPRAY | Freq: Once | RESPIRATORY_TRACT | Status: AC
  Administered 2015-12-31: 2 via RESPIRATORY_TRACT
  Filled 2015-12-31: qty 6.7

## 2015-12-31 MED ORDER — NAPROXEN 250 MG PO TABS
500.0000 mg | ORAL_TABLET | Freq: Once | ORAL | Status: AC
  Administered 2015-12-31: 500 mg via ORAL
  Filled 2015-12-31: qty 2

## 2015-12-31 NOTE — ED Triage Notes (Signed)
Pt reports pain in her R buttock that started this am. Pt also has cough that started 2 weeks ago. Dry hacking cough.

## 2015-12-31 NOTE — Discharge Instructions (Signed)
Take 2 puffs of your inhaler every 4 hours if you are coughing or wheezing.  Apply heat to your lower back and buttock area 2-3 times daily for 20 minutes.  Get rechecked if your symptoms are not improving over the next week or for any worsened symptoms.

## 2016-01-01 NOTE — ED Provider Notes (Signed)
[facility] DEPT Provider Note   CSN: [id number] Arrival date & time: 12/31/15  1956     History   Chief Complaint Chief Complaint  Patient presents with  . Back Pain    HPI Robin Frost is a 50 y.o. female presenting with 2 complaints, the most pressing being right lower back and buttock pain which is worsened with movement and certain positions.  She denies a history of chronic back pain.    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  She denies radiation of pain into either lower extremity.  There has been no weakness or numbness in the lower extremities and no urinary or bowel retention or incontinence. She denies dysuria. Patient does not have a history of cancer or IVDU.  She has used rest without relief. . Additionally describes a dry cough x 2 weeks.  Denies chest pain, sob, fevers or wheezing.  She is a smoker. The history is provided by the patient.    Past Medical History:  Diagnosis Date  . Vertigo     Patient Active Problem List   Diagnosis Date Noted  . Benign positional vertigo 12/19/2015    Past Surgical History:  Procedure Laterality Date  . CARPAL TUNNEL RELEASE     left wrist  . GANGLION CYST EXCISION     right foot  . TUBAL LIGATION      OB History    Gravida Para Term Preterm AB Living   2 2 2          SAB TAB Ectopic Multiple Live Births                   Home Medications    Prior to Admission medications   Medication Sig Start Date End Date Taking? Authorizing Provider  benzonatate (TESSALON) 100 MG capsule Take 2 capsules (200 mg total) by mouth 3 (three) times daily as needed. 12/31/15   Burgess AmorJulie Nayelie Gionfriddo, PA-C  meclizine (ANTIVERT) 25 MG tablet Take 1 tablet (25 mg total) by mouth 3 (three) times daily as needed for dizziness. 11/24/15   Bethann BerkshireJoseph Zammit, MD  naproxen (NAPROSYN) 500 MG tablet Take 1 tablet (500 mg total) by mouth 2 (two) times  daily. 12/31/15   Burgess AmorJulie Telvin Reinders, PA-C  pseudoephedrine (SUDAFED) 60 MG tablet Take 60 mg by mouth every 4 (four) hours as needed for congestion.    Historical Provider, MD    Family History Family History  Problem Relation Age of Onset  . Diabetes Mother   . Hypertension Mother   . Diabetes Father   . Hypertension Father   . Diabetes Sister   . Hypertension Sister   . Thyroid disease Brother     Social History Social History  Substance Use Topics  . Smoking status: Current Every Day Smoker    Packs/day: 0.50    Years: 5.00  . Smokeless tobacco: Never Used  . Alcohol use No     Allergies   Patient has no known allergies.   Review of Systems Review of Systems  Constitutional: Negative for fever.  Respiratory: Positive for cough. Negative for shortness of breath.   Cardiovascular: Negative for chest pain and leg swelling.  Gastrointestinal: Negative for abdominal distention, abdominal pain and constipation.  Genitourinary: Negative for difficulty urinating, dysuria, flank pain, frequency and urgency.  Musculoskeletal: Positive for back pain. Negative for gait problem and joint swelling.  Skin: Negative for rash.  Neurological: Negative for weakness and numbness.     Physical Exam Updated Vital Signs BP 130/89   Pulse 86   Resp 16   Ht 5\' 2"  (1.575 m)   Wt 96.6 kg   LMP 12/19/2015   SpO2 100%   BMI 38.96 kg/m   Physical Exam  Constitutional: She appears well-developed and well-nourished.  HENT:  Head: Normocephalic.  Eyes: Conjunctivae are normal.  Neck: Normal range of motion. Neck supple.  Cardiovascular: Normal rate and intact distal pulses.   Pedal pulses normal.  Pulmonary/Chest: Effort normal and breath sounds normal. No respiratory distress. She has no wheezes.  Abdominal: Soft. Bowel sounds are normal. She exhibits no distension and no mass.  Musculoskeletal: Normal range of motion. She exhibits no edema.       Lumbar back: She exhibits tenderness.  She exhibits no swelling, no edema and no spasm.  Neurological: She is alert. She has normal strength. She displays no atrophy and no tremor. No sensory deficit. Gait normal.  Reflex Scores:      Patellar reflexes are 2+ on the right side and 2+ on the left side.      Achilles reflexes are 2+ on the right side and 2+ on the left side. No strength deficit noted in hip and knee flexor and extensor muscle groups.  Ankle flexion and extension intact.  Skin: Skin is warm and dry.  Psychiatric: She has a normal mood and affect.  Nursing note and vitals reviewed.    ED Treatments / Results  Labs (all labs ordered are listed, but only abnormal results are displayed) Labs Reviewed - No data to display  EKG  EKG Interpretation None       Radiology No results found.  Procedures Procedures (including critical care time)  Medications Ordered in ED Medications  benzonatate (TESSALON) capsule 200 mg (200 mg Oral Given 12/31/15 2201)  naproxen (NAPROSYN) tablet 500 mg (500 mg Oral Given 12/31/15 2201)  albuterol (PROVENTIL HFA;VENTOLIN HFA) 108 (90 Base) MCG/ACT inhaler 2 puff (2 puffs Inhalation Given 12/31/15 2202)     Initial Impression / Assessment and Plan / ED Course  I have reviewed the triage vital signs and the nursing notes.  Pertinent labs & imaging results that were available during my care of the patient were reviewed by me and considered in my medical decision making (see chart for details).  Clinical Course     No neuro deficit on exam or by history to suggest emergent or surgical presentation.  discussed worsened sx that should prompt immediate re-evaluation including distal weakness, bowel/bladder retention/incontinence.  Lungs ctab. Discussed smoking cessation. Tessalon.        Final Clinical Impressions(s) / ED Diagnoses   Final diagnoses:  Muscle strain  Acute bronchitis, unspecified organism    New Prescriptions Discharge Medication List as of  12/31/2015  9:41 PM    START taking these medications   Details  benzonatate (TESSALON) 100 MG capsule Take 2 capsules (200 mg total) by mouth 3 (three) times daily as needed., Starting Mon 12/31/2015, Print    naproxen (NAPROSYN) 500 MG tablet Take 1 tablet (500 mg total) by mouth 2 (two) times daily., Starting Mon 12/31/2015, Print         Burgess AmorJulie Lysette Lindenbaum, PA-C 01/01/16 1255    Maia PlanJoshua G Long, MD 01/01/16 1416

## 2016-01-17 ENCOUNTER — Telehealth: Payer: Self-pay | Admitting: *Deleted

## 2016-01-17 NOTE — Telephone Encounter (Signed)
Pt informed of abnormal pap (LSIL, +HPV) results from 12/05/2015. Colposcopy scheduled for 01/31/2016 with Dr.Ferguson. Procedure discussed and all questions answered with pt verbalizing understanding.

## 2016-01-31 ENCOUNTER — Other Ambulatory Visit: Payer: Self-pay | Admitting: Obstetrics and Gynecology

## 2016-01-31 ENCOUNTER — Encounter: Payer: Self-pay | Admitting: Obstetrics and Gynecology

## 2016-01-31 ENCOUNTER — Ambulatory Visit (INDEPENDENT_AMBULATORY_CARE_PROVIDER_SITE_OTHER): Payer: 59 | Admitting: Obstetrics and Gynecology

## 2016-01-31 VITALS — BP 126/70 | HR 77 | Wt 215.8 lb

## 2016-01-31 DIAGNOSIS — R87612 Low grade squamous intraepithelial lesion on cytologic smear of cervix (LGSIL): Secondary | ICD-10-CM

## 2016-01-31 DIAGNOSIS — Z3202 Encounter for pregnancy test, result negative: Secondary | ICD-10-CM | POA: Diagnosis not present

## 2016-01-31 DIAGNOSIS — N870 Mild cervical dysplasia: Secondary | ICD-10-CM | POA: Diagnosis not present

## 2016-01-31 DIAGNOSIS — Z0000 Encounter for general adult medical examination without abnormal findings: Secondary | ICD-10-CM | POA: Diagnosis not present

## 2016-01-31 LAB — POCT URINE PREGNANCY: PREG TEST UR: NEGATIVE

## 2016-01-31 NOTE — Progress Notes (Signed)
Patient ID: Robin Frost Pacha, female   DOB: [birth year], 50 y.o.   MRN: [id number]  Chief Complaint  Patient presents with  . Colposcopy     Colposcopy Procedure Note  Robin Frost Speyer 50 y.o. G2P2000 here for colposcopy for low-grade squamous intraepithelial neoplasia (LGSIL - encompassing HPV,mild dysplasia,CIN I) pap smear on 12/05/15. Pt states she has prior h/o abnormal pap and colpo, but no h/o LEEP or conization to her knowledge.   Discussed role for HPV in cervical dysplasia, need for surveillance.  Patient given informed consent, signed copy in the chart, time out was performed.  Placed in lithotomy position. Cervix viewed with speculum and colposcope after application of acetic acid.   Colposcopy adequate? Yes Cervix: Acetowhite lesions noted at 6 o'clock ; two biopsies obtained at 6 o'clock.   ECC specimen obtained. All specimens were labelled and sent to pathology.   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 Will follow up pathology and manage accordingly. Results via MyChart.  Routine preventative health maintenance measures emphasized.   By signing my name below, I, Doreatha MartinEva Mathews, attest that this documentation has been prepared under the direction and in the presence of Tilda BurrowJohn V Chaos Carlile, MD. Electronically Signed: Doreatha MartinEva Mathews, ED Scribe. 01/31/16. 9:42 AM.  I personally performed the services described in this documentation, which was SCRIBED in my presence. The recorded information has been reviewed and considered accurate. It has been edited as necessary during review. Tilda BurrowFERGUSON,Dayon Witt V, MD

## 2016-03-13 DIAGNOSIS — I10 Essential (primary) hypertension: Secondary | ICD-10-CM | POA: Diagnosis not present

## 2016-03-13 DIAGNOSIS — E785 Hyperlipidemia, unspecified: Secondary | ICD-10-CM | POA: Diagnosis not present

## 2016-03-13 DIAGNOSIS — F17210 Nicotine dependence, cigarettes, uncomplicated: Secondary | ICD-10-CM | POA: Diagnosis not present

## 2016-03-14 MED FILL — LOVASTATIN 20 MG TABLET: 20 | 30 days supply | Qty: 30 | Fill #0

## 2016-03-14 MED FILL — HYDROCHLOROTHIAZIDE 25 MG T: 25 | 30 days supply | Qty: 30 | Fill #0

## 2018-04-01 ENCOUNTER — Other Ambulatory Visit (HOSPITAL_COMMUNITY): Payer: Self-pay | Admitting: Family Medicine

## 2018-04-01 DIAGNOSIS — R928 Other abnormal and inconclusive findings on diagnostic imaging of breast: Secondary | ICD-10-CM

## 2018-04-01 DIAGNOSIS — N6323 Unspecified lump in the left breast, lower outer quadrant: Secondary | ICD-10-CM

## 2018-04-06 ENCOUNTER — Encounter: Payer: Self-pay | Admitting: Internal Medicine

## 2018-04-26 ENCOUNTER — Ambulatory Visit: Payer: 59

## 2018-04-27 ENCOUNTER — Other Ambulatory Visit (HOSPITAL_COMMUNITY): Payer: 59

## 2018-04-27 ENCOUNTER — Encounter (HOSPITAL_COMMUNITY): Payer: 59

## 2018-05-04 ENCOUNTER — Other Ambulatory Visit (HOSPITAL_COMMUNITY)
Admission: RE | Admit: 2018-05-04 | Discharge: 2018-05-04 | Disposition: A | Discharge status: Home / Self Care | Payer: BLUE CROSS/BLUE SHIELD | Source: Ambulatory Visit | Attending: Adult Health | Admitting: Adult Health

## 2018-05-04 ENCOUNTER — Ambulatory Visit (INDEPENDENT_AMBULATORY_CARE_PROVIDER_SITE_OTHER): Payer: BLUE CROSS/BLUE SHIELD | Admitting: Adult Health

## 2018-05-04 ENCOUNTER — Encounter: Payer: Self-pay | Admitting: Adult Health

## 2018-05-04 ENCOUNTER — Other Ambulatory Visit: Payer: Self-pay

## 2018-05-04 VITALS — BP 138/82 | HR 71 | Ht 62.0 in | Wt 224.5 lb

## 2018-05-04 DIAGNOSIS — Z1212 Encounter for screening for malignant neoplasm of rectum: Secondary | ICD-10-CM | POA: Diagnosis not present

## 2018-05-04 DIAGNOSIS — Z8742 Personal history of other diseases of the female genital tract: Secondary | ICD-10-CM

## 2018-05-04 DIAGNOSIS — N951 Menopausal and female climacteric states: Secondary | ICD-10-CM | POA: Diagnosis not present

## 2018-05-04 DIAGNOSIS — Z1211 Encounter for screening for malignant neoplasm of colon: Secondary | ICD-10-CM

## 2018-05-04 DIAGNOSIS — Z01419 Encounter for gynecological examination (general) (routine) without abnormal findings: Secondary | ICD-10-CM | POA: Insufficient documentation

## 2018-05-04 DIAGNOSIS — Z124 Encounter for screening for malignant neoplasm of cervix: Secondary | ICD-10-CM

## 2018-05-04 LAB — HEMOCCULT GUIAC POC 1CARD (OFFICE): Fecal Occult Blood, POC: NEGATIVE

## 2018-05-04 NOTE — Patient Instructions (Signed)

## 2018-05-04 NOTE — Progress Notes (Signed)
Patient ID: Robin Frost, female   DOB: 1965-04-27, 54 y.o.   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 PCP is Robin Pearson NP at [facility].    Current Medications, Allergies, Past Medical History, Past Surgical History, Family History and Social History were reviewed in [employer] record.     Review of Systems: Patient denies any headaches, hearing loss, fatigue, blurred vision, shortness of breath, chest pain, abdominal pain, problems with bowel movements, urination, or intercourse. No joint pain or mood swings. Still having periods, but may skip occasionally.No hot flashes yet.     Physical Exam:BP 138/82 (BP Location: Left Arm, Patient Position: Sitting, Cuff Size: Large)   Pulse 71   Ht 5\' 2"  (1.575 m)   Wt 224 lb 8 oz (101.8 kg)   LMP  (LMP Unknown)   BMI 41.06 kg/m  General:  Well developed, well nourished, no acute distress Skin:  Warm and dry Neck:  Midline trachea, normal thyroid, good ROM, no lymphadenopathy Lungs; Clear to auscultation bilaterall Cardiovascular: Regular rate and rhythm Pelvic:  External genitalia is normal in appearance, no lesions.  The vagina is normal in appearance. Urethra has no lesions or masses. The cervix is bulbous,Pap with HPV performed.  Uterus is felt to be normal size, shape, and contour.  No adnexal masses or tenderness noted.Bladder is non tender, no masses felt. Rectal: Good sphincter tone, no polyps, or hemorrhoids felt.  Hemoccult negative. Psych:  No mood changes, alert and cooperative,seems happy Fall risk is low pHQ 2 score 0. Examination chaperoned by Marchelle Folks Rash LPN.   Impression:  1. Encounter for gynecological examination with Papanicolaou smear of cervix   2. Screening for colorectal cancer   3.  Perimenopause   4. History of abnormal cervical Pap smear      Plan: Physical with PCP Pap in 3 if normal Mammogram 05/11/2018 Colonoscopy 05/17/2018 Labs with PCP Review handout on perimenopause

## 2018-05-05 LAB — CYTOLOGY - PAP
Diagnosis: UNDETERMINED — AB
HPV: NOT DETECTED

## 2018-05-06 ENCOUNTER — Telehealth: Payer: Self-pay | Admitting: Adult Health

## 2018-05-06 DIAGNOSIS — Z8742 Personal history of other diseases of the female genital tract: Secondary | ICD-10-CM

## 2018-05-06 NOTE — Telephone Encounter (Signed)
Pt aware that Pap was ASCUS but negative HPV, repeat in 1 year, placed in recall

## 2018-05-11 ENCOUNTER — Ambulatory Visit (HOSPITAL_COMMUNITY): Payer: BLUE CROSS/BLUE SHIELD

## 2018-05-11 ENCOUNTER — Other Ambulatory Visit: Payer: Self-pay

## 2018-05-11 ENCOUNTER — Ambulatory Visit (HOSPITAL_COMMUNITY)
Admission: RE | Admit: 2018-05-11 | Discharge: 2018-05-11 | Disposition: A | Discharge status: Home / Self Care | Payer: BLUE CROSS/BLUE SHIELD | Source: Ambulatory Visit | Attending: Family Medicine | Admitting: Family Medicine

## 2018-05-11 DIAGNOSIS — N6323 Unspecified lump in the left breast, lower outer quadrant: Secondary | ICD-10-CM

## 2018-05-11 DIAGNOSIS — R928 Other abnormal and inconclusive findings on diagnostic imaging of breast: Secondary | ICD-10-CM | POA: Insufficient documentation

## 2018-05-17 ENCOUNTER — Ambulatory Visit: Payer: Self-pay

## 2018-06-16 ENCOUNTER — Encounter: Payer: Self-pay | Admitting: *Deleted

## 2018-06-28 ENCOUNTER — Ambulatory Visit: Payer: Self-pay

## 2018-07-07 ENCOUNTER — Encounter: Payer: Self-pay | Admitting: Internal Medicine

## 2018-07-19 ENCOUNTER — Ambulatory Visit: Payer: Self-pay

## 2018-09-21 ENCOUNTER — Ambulatory Visit: Payer: Self-pay

## 2021-02-07 IMAGING — [facility] DIGITAL DIAGNOSTIC BILATERAL MAMMOGRAM WITH TOMO AND CAD
6 of 10 series · 6 of 30 positions shown · non-contrast
Comparison: Previous exam(s).

CLINICAL DATA: Two year follow-up of a left breast mass located
laterally.

EXAM:
DIGITAL DIAGNOSTIC BILATERAL MAMMOGRAM WITH CAD AND TOMO
ULTRASOUND LEFT BREAST

[L CC synth-2D]
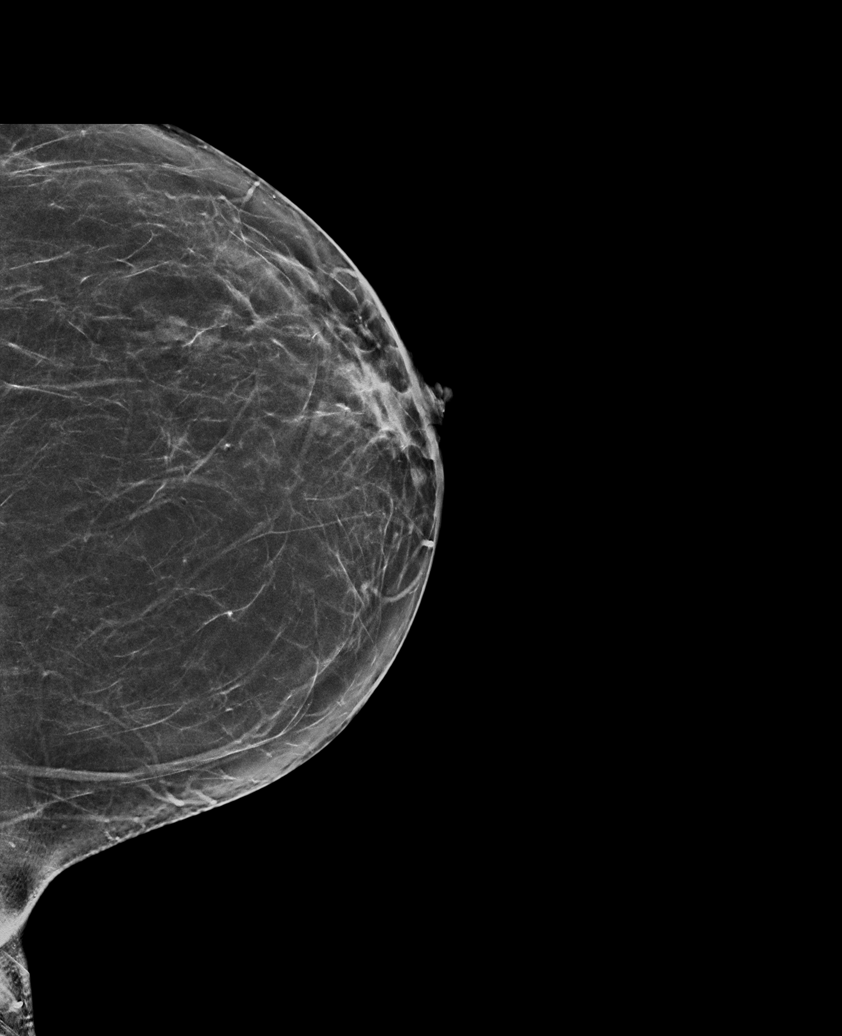

[R CC synth-2D]
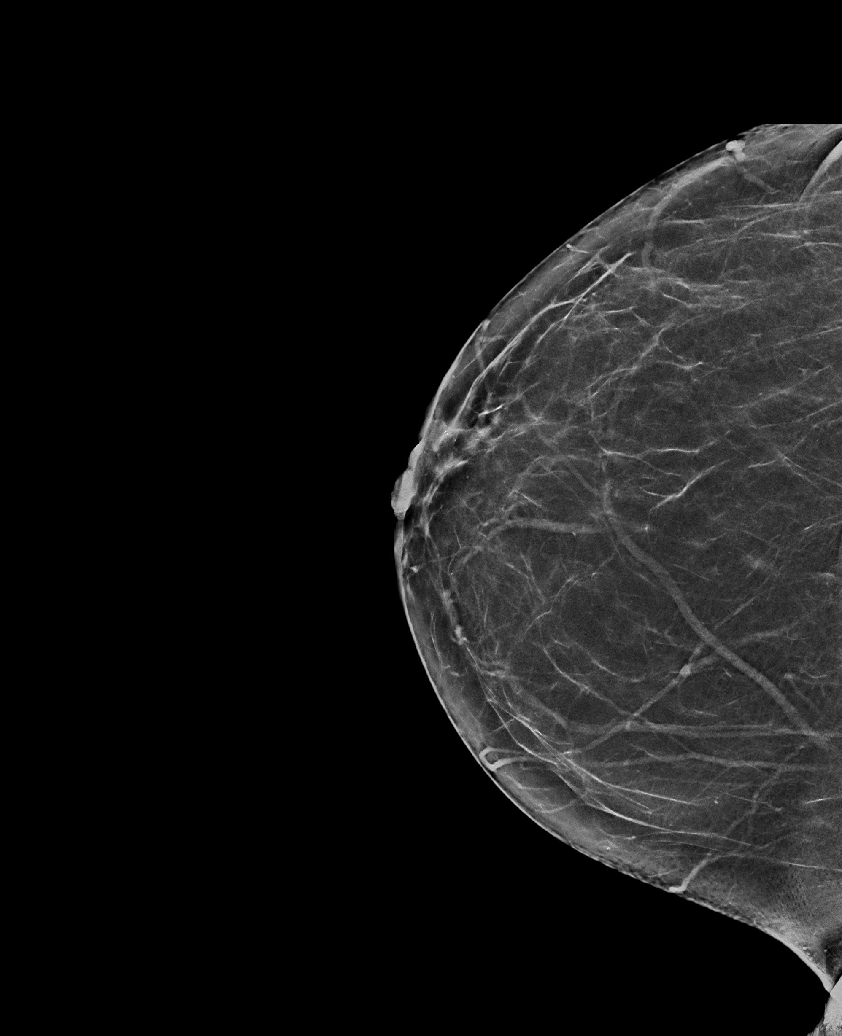

[R MLO synth-2D]
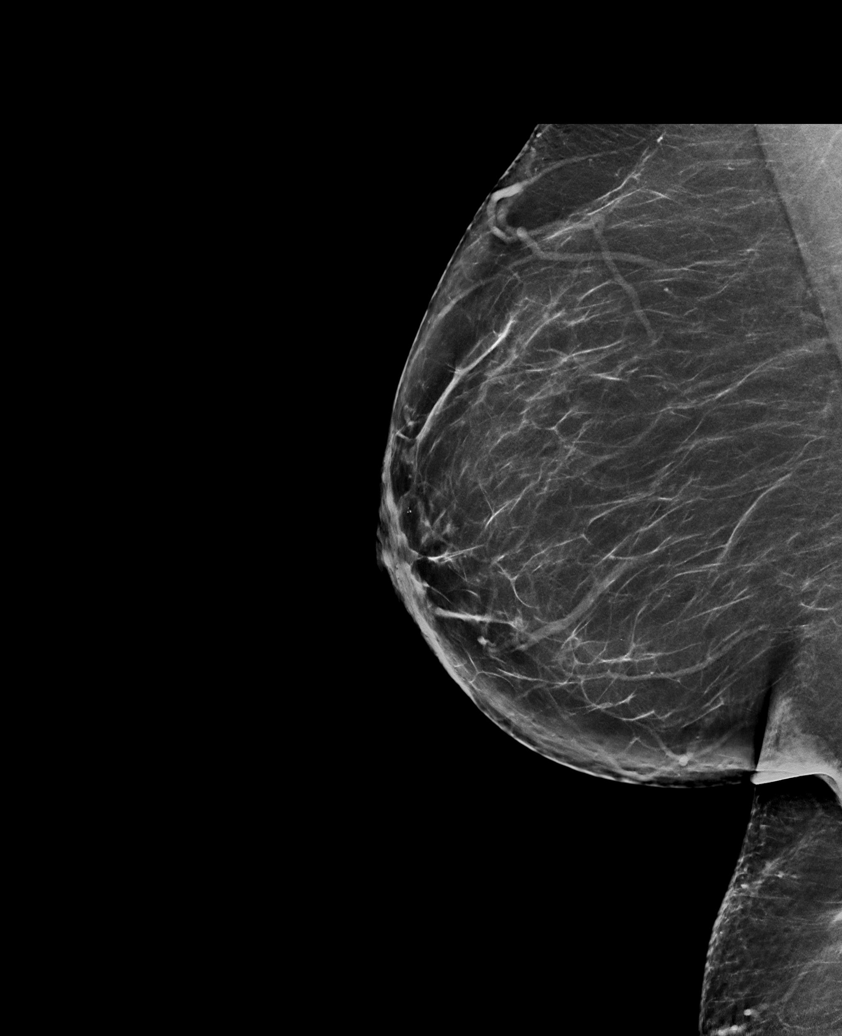

[L MLO synth-2D (1 of 2)]
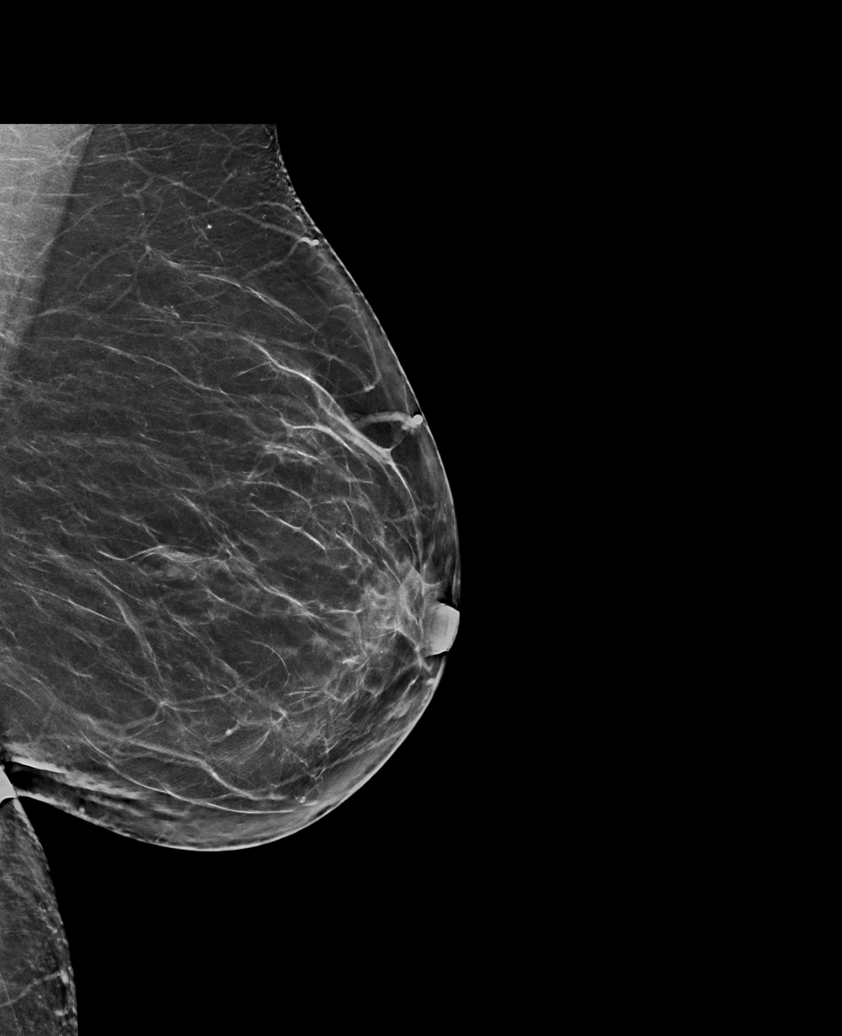

[L MLO synth-2D (2 of 2)]
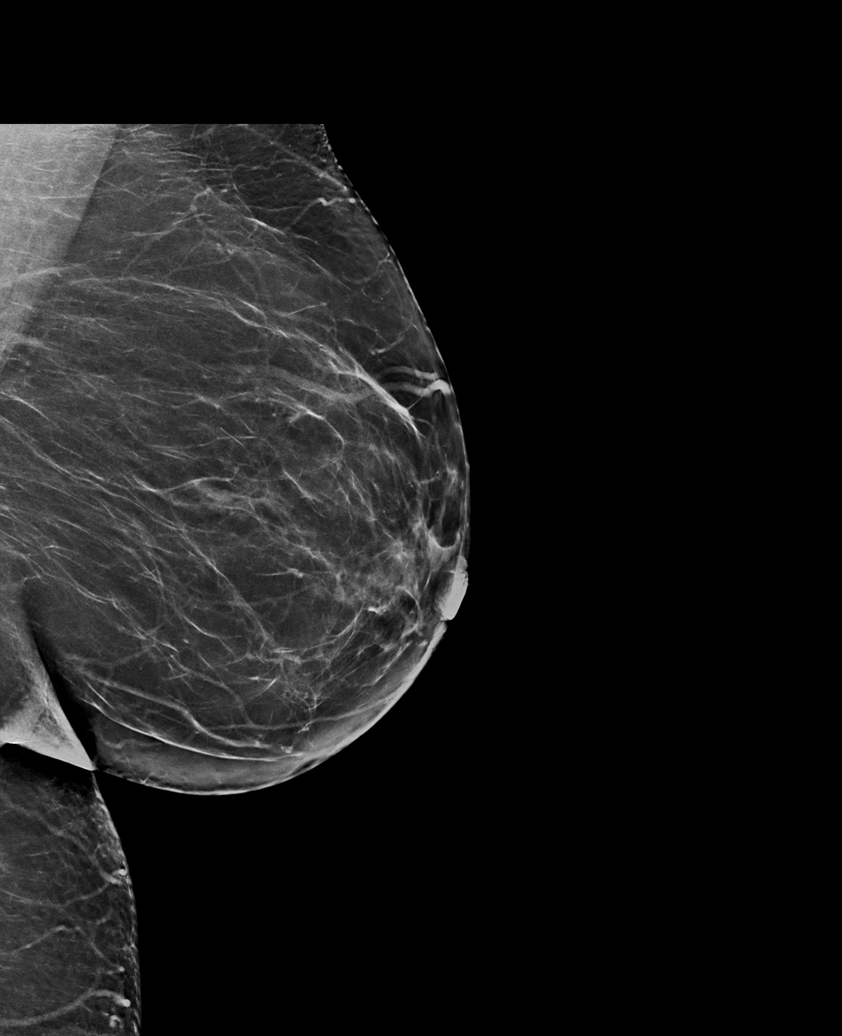

[R CC tomo · tomo slice 35/68.0]
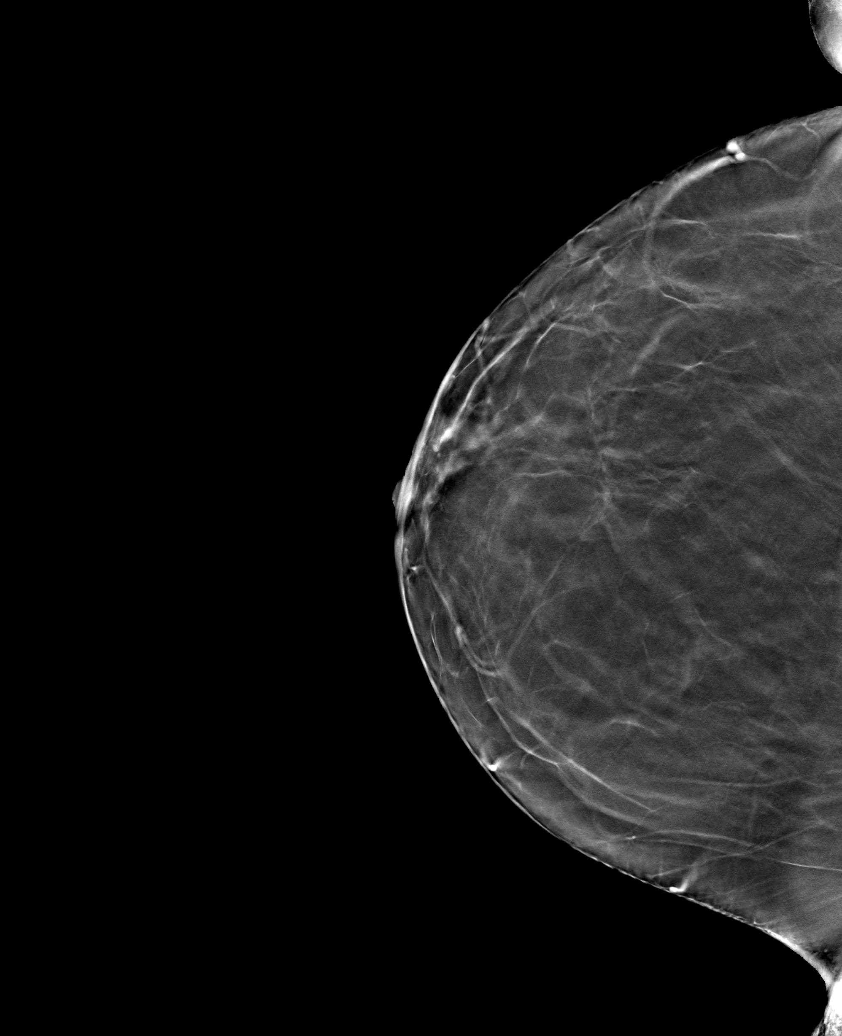

[6 of 30 positions shown; findings below may reference images not displayed]

ACR Breast Density Category b: There are scattered areas of
fibroglandular density.
FINDINGS: The lateral left breast mass is stable mammographically. No other
suspicious masses, calcifications, or distortion in the left breast.

Mammographic images were processed with CAD.

On physical exam, no suspicious lumps are identified.

Targeted ultrasound is performed, showing a cluster of cysts at 3
o'clock, 2 cm from the nipple measuring 11 x 6 x 9 mm today versus
11 x 4 x 9 mm previously.
IMPRESSION: No significant change in the left posterior cysts. No mammographic
or sonographic evidence of malignancy.

RECOMMENDATION:
Annual screening mammography.

I have discussed the findings and recommendations with the patient.
Results were also provided in writing at the conclusion of the
visit. If applicable, a reminder letter will be sent to the patient
regarding the next appointment.

BI-RADS CATEGORY  2: Benign.
